# Patient Record
Sex: Female | Born: 2000
Health system: Southern US, Community
[De-identification: ages and names within clinical notes are randomized; demographics above are authoritative.]

## PROBLEM LIST (undated history)

## (undated) DIAGNOSIS — O039 Complete or unspecified spontaneous abortion without complication: Secondary | ICD-10-CM

## (undated) HISTORY — DX: Complete or unspecified spontaneous abortion without complication: O03.9

---
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
## 2018-02-08 ENCOUNTER — Inpatient Hospital Stay (HOSPITAL_COMMUNITY)
Admission: AD | Admit: 2018-02-08 | Discharge: 2018-02-09 | DRG: 779 | Disposition: A | Discharge status: Home / Self Care | Payer: BLUE CROSS/BLUE SHIELD | Attending: Certified Nurse Midwife | Admitting: Certified Nurse Midwife

## 2018-02-08 ENCOUNTER — Encounter (HOSPITAL_COMMUNITY): Payer: Self-pay | Admitting: *Deleted

## 2018-02-08 DIAGNOSIS — Z332 Encounter for elective termination of pregnancy: Secondary | ICD-10-CM | POA: Diagnosis not present

## 2018-02-08 DIAGNOSIS — Z3A18 18 weeks gestation of pregnancy: Secondary | ICD-10-CM | POA: Diagnosis not present

## 2018-02-08 DIAGNOSIS — O039 Complete or unspecified spontaneous abortion without complication: Secondary | ICD-10-CM | POA: Diagnosis not present

## 2018-02-08 DIAGNOSIS — O021 Missed abortion: Secondary | ICD-10-CM | POA: Diagnosis present

## 2018-02-08 LAB — COMPREHENSIVE METABOLIC PANEL
ALT: 12 U/L (ref 0–44)
ANION GAP: 11 (ref 5–15)
AST: 17 U/L (ref 15–41)
Albumin: 3.4 g/dL — ABNORMAL LOW (ref 3.5–5.0)
Alkaline Phosphatase: 42 U/L (ref 38–126)
BILIRUBIN TOTAL: 0.8 mg/dL (ref 0.3–1.2)
BUN: 8 mg/dL (ref 6–20)
CALCIUM: 8.8 mg/dL — AB (ref 8.9–10.3)
CO2: 19 mmol/L — ABNORMAL LOW (ref 22–32)
Chloride: 104 mmol/L (ref 98–111)
Creatinine, Ser: 0.48 mg/dL (ref 0.44–1.00)
GFR calc Af Amer: >60 mL/min (ref >60)
Glucose, Bld: 118 mg/dL — ABNORMAL HIGH (ref 70–99)
POTASSIUM: 3.5 mmol/L (ref 3.5–5.1)
Sodium: 134 mmol/L — ABNORMAL LOW (ref 135–145)
TOTAL PROTEIN: 6.4 g/dL — AB (ref 6.5–8.1)

## 2018-02-08 LAB — PROTIME-INR
INR: 1.06
Prothrombin Time: 13.7 seconds (ref 11.4–15.2)

## 2018-02-08 LAB — CBC
HCT: 31.9 % — ABNORMAL LOW (ref 36.0–46.0)
Hemoglobin: 11.5 g/dL — ABNORMAL LOW (ref 12.0–15.0)
MCH: 29.3 pg (ref 26.0–34.0)
MCHC: 36.1 g/dL — AB (ref 30.0–36.0)
MCV: 81.4 fL (ref 78.0–100.0)
Platelets: 211 10*3/uL (ref 150–400)
RBC: 3.92 MIL/uL (ref 3.87–5.11)
RDW: 12.7 % (ref 11.5–15.5)
WBC: 10.2 10*3/uL (ref 4.0–10.5)

## 2018-02-08 LAB — TYPE AND SCREEN
ABO/RH(D): O POS
ANTIBODY SCREEN: NEGATIVE

## 2018-02-08 LAB — SAVE SMEAR

## 2018-02-08 LAB — FIBRINOGEN: FIBRINOGEN: 382 mg/dL (ref 210–475)

## 2018-02-08 LAB — APTT: aPTT: 29 seconds (ref 24–36)

## 2018-02-08 MED ORDER — ACETAMINOPHEN 325 MG PO TABS: 650.0000 mg | ORAL_TABLET | ORAL | Status: DC | PRN

## 2018-02-08 MED ORDER — BUTORPHANOL TARTRATE 1 MG/ML IJ SOLN
2.0000 mg | INTRAMUSCULAR | Status: DC | PRN
  Administered 2018-02-09 (×2): 2 mg via INTRAVENOUS
  Filled 2018-02-08 (×4): qty 2

## 2018-02-08 MED ORDER — OXYCODONE-ACETAMINOPHEN 5-325 MG PO TABS: 1.0000 | ORAL_TABLET | ORAL | Status: DC | PRN

## 2018-02-08 MED ORDER — ONDANSETRON HCL 4 MG PO TABS: 4.0000 mg | ORAL_TABLET | Freq: Three times a day (TID) | ORAL | Status: DC | PRN

## 2018-02-08 MED ORDER — MISOPROSTOL 200 MCG PO TABS
400.0000 ug | ORAL_TABLET | ORAL | Status: AC | PRN
  Administered 2018-02-08 – 2018-02-09 (×3): 400 ug via VAGINAL
  Filled 2018-02-08 (×2): qty 2

## 2018-02-08 MED ORDER — SODIUM CHLORIDE 0.9% FLUSH: 3.0000 mL | INTRAVENOUS | Status: DC | PRN

## 2018-02-08 MED ORDER — LIDOCAINE HCL (PF) 1 % IJ SOLN: 30.0000 mL | INTRAMUSCULAR | Status: DC | PRN

## 2018-02-08 MED ORDER — OXYTOCIN BOLUS FROM INFUSION
500.0000 mL | Freq: Once | INTRAVENOUS | Status: AC
  Administered 2018-02-09: 500 mL via INTRAVENOUS

## 2018-02-08 MED ORDER — ONDANSETRON HCL 4 MG/2ML IJ SOLN
4.0000 mg | Freq: Four times a day (QID) | INTRAMUSCULAR | Status: DC | PRN
  Administered 2018-02-09: 4 mg via INTRAVENOUS
  Filled 2018-02-08: qty 2

## 2018-02-08 MED ORDER — LACTATED RINGERS IV SOLN: 500.0000 mL | INTRAVENOUS | Status: DC | PRN

## 2018-02-08 MED ORDER — OXYTOCIN 10 UNIT/ML IJ SOLN: 10.0000 [IU] | Freq: Once | INTRAMUSCULAR | Status: DC

## 2018-02-08 MED ORDER — SODIUM CHLORIDE 0.9 % IV SOLN: 250.0000 mL | INTRAVENOUS | Status: DC | PRN

## 2018-02-08 MED ORDER — OXYCODONE-ACETAMINOPHEN 5-325 MG PO TABS: 2.0000 | ORAL_TABLET | ORAL | Status: DC | PRN

## 2018-02-08 MED ORDER — OXYTOCIN 40 UNITS IN LACTATED RINGERS INFUSION - SIMPLE MED
2.5000 [IU]/h | INTRAVENOUS | Status: DC
  Filled 2018-02-08: qty 1000

## 2018-02-08 MED ORDER — MISOPROSTOL 200 MCG PO TABS
ORAL_TABLET | ORAL | Status: AC
  Administered 2018-02-08: 400 ug via VAGINAL
  Filled 2018-02-08: qty 2

## 2018-02-08 MED ORDER — HYDROXYZINE HCL 50 MG PO TABS: 50.0000 mg | ORAL_TABLET | Freq: Four times a day (QID) | ORAL | Status: DC | PRN

## 2018-02-08 MED ORDER — SODIUM CHLORIDE 0.9% FLUSH: 3.0000 mL | Freq: Two times a day (BID) | INTRAVENOUS | Status: DC

## 2018-02-08 MED ORDER — SOD CITRATE-CITRIC ACID 500-334 MG/5ML PO SOLN: 30.0000 mL | ORAL | Status: DC | PRN

## 2018-02-08 MED ORDER — ZOLPIDEM TARTRATE 5 MG PO TABS
5.0000 mg | ORAL_TABLET | Freq: Every evening | ORAL | Status: DC | PRN
  Administered 2018-02-09: 5 mg via ORAL
  Filled 2018-02-08: qty 1

## 2018-02-08 NOTE — H&P (Signed)
OB ADMISSION/ HISTORY & PHYSICAL:  Admission Date: 02/08/2018  6:01 PM  Admit Diagnosis: 16 WKS, MISCARRIAGE, INDUCTION    Haley Glover is a 17 y.o. female presenting for IOL, IUFD diagnosed Friday at routine anatomy scan. IUP at 19 wks, fetus measuring 16 wks, edema vs hydrops, no abruption, AFI wnl.  Patient counseled for IOL vs surgical management with D&E, and after a day of consideration, opted for medical management.  Reports mild menstrual like cramping for past few days, denies VB/LOF. No N/V.  Appropriately sad, has good family support from [place] / spouse and her parents.  Desires pathology/genetic testing on fetus.   Prenatal History: G1P0   EDC : 07/10/2018, by Other Basis  [facility] since 8   Prenatal course complicated by: Teen, hx abdominal sx 05/2018 - s/p peritonitis d/t ruptured appendix, UTI in 1st trim tx w/ Augmentin  Prenatal Labs: ABO, Rh:   O pos Antibody:  neg Rubella:   immune RPR:   NR HBsAg:   NR HIV: Non Reactive (12/25 2215)  No genetic testing  Medical / Surgical History :  Past medical history: No past medical history on file.   Past surgical history:  Past Surgical History:  Procedure Laterality Date  . LAPAROSCOPIC APPENDECTOMY N/A 06/11/2017   Procedure: APPENDECTOMY  OPEN;  Surgeon: Berna Bueonnor, Chelsea A, MD;  [facility];  Service: General;  Laterality: N/A;     Family History: History reviewed. No pertinent family history.   Social History:  reports that she has never smoked. She has never used smokeless tobacco. She reports that she does not drink alcohol or use drugs.   Allergies: Almond oil; Cherry extract; and Pineapple   Current Medications at time of admission:  Medications Prior to Admission  Medication Sig Dispense Refill Last Dose  . methocarbamol (ROBAXIN) 500 MG tablet Take 1 tablet (500 mg total) by mouth every 6 (six) hours as needed for muscle spasms. 20 tablet 0   . ondansetron  (ZOFRAN) 4 MG tablet Take 4 mg by mouth every 8 (eight) hours as needed for nausea or vomiting.   06/10/2017 at Unknown time  . Phenazopyridine HCl (AZO-STANDARD PO) Take 1-2 tablets by mouth every 3 (three) hours as needed (for pain).   06/10/2017 at am     Review of Systems: ROS  Physical Exam: Vital signs and nursing notes reviewed.   General: AAO x 3, NAD, coping appropriate Heart: RRR Lungs:CTAB Abdomen: Gravid, NT Extremities: no edema Genitalia / VE:   cvx long/thick/long, no lesions   Labs:   Pending T&S, CBC, CMP, DIC panel   Assessment/Plan:  17 y.o. G1P0 at 6950w2d per sure dates, IUFD TORCH and APL panel pending in office Risk of retained placenta/bleeding and need for surgical intervention discussed Cytotec IOL - 400 mcg vaginal q 3 hrs until active labor IV pain medication Stadol 2mg  q 3 hrs PRN Patient may have epidural if desires Bereavement support  POC for pathology/genetic testing   Dr Ernestina PennaFogleman notified of admission / plan of care   Neta Mendsaniela C Paul CNM, MSN 02/08/2018, 8:10 PM

## 2018-02-09 ENCOUNTER — Encounter (HOSPITAL_COMMUNITY): Payer: Self-pay

## 2018-02-09 ENCOUNTER — Inpatient Hospital Stay (HOSPITAL_COMMUNITY): Payer: BLUE CROSS/BLUE SHIELD | Admitting: Anesthesiology

## 2018-02-09 ENCOUNTER — Other Ambulatory Visit: Payer: Self-pay

## 2018-02-09 ENCOUNTER — Other Ambulatory Visit (HOSPITAL_COMMUNITY)
Admission: AD | Admit: 2018-02-09 | Discharge: 2018-02-09 | Disposition: A | Discharge status: Home / Self Care | Payer: 59 | Source: Ambulatory Visit | Attending: Certified Nurse Midwife | Admitting: Certified Nurse Midwife

## 2018-02-09 DIAGNOSIS — O039 Complete or unspecified spontaneous abortion without complication: Secondary | ICD-10-CM | POA: Diagnosis not present

## 2018-02-09 DIAGNOSIS — Z332 Encounter for elective termination of pregnancy: Secondary | ICD-10-CM | POA: Diagnosis not present

## 2018-02-09 LAB — ABO/RH: ABO/RH(D): O POS

## 2018-02-09 LAB — RPR: RPR Ser Ql: NONREACTIVE

## 2018-02-09 MED ORDER — PHENYLEPHRINE 40 MCG/ML (10ML) SYRINGE FOR IV PUSH (FOR BLOOD PRESSURE SUPPORT)
80.0000 ug | PREFILLED_SYRINGE | INTRAVENOUS | Status: DC | PRN
  Filled 2018-02-09: qty 10

## 2018-02-09 MED ORDER — PHENYLEPHRINE 40 MCG/ML (10ML) SYRINGE FOR IV PUSH (FOR BLOOD PRESSURE SUPPORT): 80.0000 ug | PREFILLED_SYRINGE | INTRAVENOUS | Status: DC | PRN

## 2018-02-09 MED ORDER — EPHEDRINE 5 MG/ML INJ: 10.0000 mg | INTRAVENOUS | Status: DC | PRN

## 2018-02-09 MED ORDER — MISOPROSTOL 200 MCG PO TABS
400.0000 ug | ORAL_TABLET | Freq: Once | ORAL | Status: AC
  Administered 2018-02-09: 400 ug via VAGINAL
  Filled 2018-02-09: qty 2

## 2018-02-09 MED ORDER — LIDOCAINE HCL (PF) 1 % IJ SOLN
INTRAMUSCULAR | Status: DC | PRN
  Administered 2018-02-09: 5 mL via EPIDURAL

## 2018-02-09 MED ORDER — HYDROXYZINE HCL 50 MG PO TABS: 50.0000 mg | ORAL_TABLET | Freq: Four times a day (QID) | ORAL | 0 refills | Status: DC | PRN

## 2018-02-09 MED ORDER — LACTATED RINGERS IV SOLN
INTRAVENOUS | Status: DC
  Administered 2018-02-09: 10:00:00 via INTRAVENOUS

## 2018-02-09 MED ORDER — IBUPROFEN 600 MG PO TABS: 600.0000 mg | ORAL_TABLET | Freq: Four times a day (QID) | ORAL | Status: DC | PRN

## 2018-02-09 MED ORDER — LACTATED RINGERS IV SOLN
500.0000 mL | Freq: Once | INTRAVENOUS | Status: DC
  Administered 2018-02-09: 500 mL via INTRAVENOUS

## 2018-02-09 MED ORDER — IBUPROFEN 600 MG PO TABS: 600.0000 mg | ORAL_TABLET | Freq: Four times a day (QID) | ORAL | 0 refills | Status: DC | PRN

## 2018-02-09 MED ORDER — FENTANYL 2.5 MCG/ML BUPIVACAINE 1/10 % EPIDURAL INFUSION (WH - ANES)
14.0000 mL/h | INTRAMUSCULAR | Status: DC | PRN
  Administered 2018-02-09: 14 mL/h via EPIDURAL
  Filled 2018-02-09: qty 100

## 2018-02-09 MED ORDER — DIPHENHYDRAMINE HCL 50 MG/ML IJ SOLN: 12.5000 mg | INTRAMUSCULAR | Status: DC | PRN

## 2018-02-09 MED ORDER — LACTATED RINGERS IV SOLN: 500.0000 mL | Freq: Once | INTRAVENOUS | Status: DC

## 2018-02-09 NOTE — Progress Notes (Signed)
I spent time throughout the day with Haley Glover and Haley Glover and their family as they labored and delivered their baby, Haley Glover.  I offered spiritual and emotional support as they began to process the grief around the loss of their baby.  I also provided them with resources for grief support post discharge.  At [facility] request, I also baptized the baby and offered prayer.  The family is appropriately tearful, but has good support.  They are aware of how to reach spiritual care for continued support.  Please page as further needs arise.  Haley Glover, M.Div. [facility] Chaplain Pager [phone] Office [phone]

## 2018-02-09 NOTE — Discharge Summary (Signed)
OB Discharge Summary  Patient Name: Haley Glover DOB: 2000-12-06 MRN: [id number]  Date of admission: 02/08/2018 Admitting diagnosis: IUFD Intrauterine pregnancy: [redacted]w[redacted]d      Date of discharge: 02/09/2018    Discharge diagnosis: complete abortion after IUFD at 18 weeks     Prenatal history: G1P0   EDC : 07/10/2018, by Other B[facility]  P[facility] care at [facility]  Primary provider : Renae Fickle CNM  Prenatal Labs: ABO, Rh: O positive Antibody: NEG (08/25 2047)                          Hospital course:   Admit for induction of labor with cytotec protocol with 2nd trimester loss (18 wk IUFD with measurements c/w 16 week size). Induction of successful labor after 4 doses cytotec vaginally.   Pain management with Stadol IV and epidural anesthesia.   Spontaneous expulsion of fetus with ROM (MSF-moderate). Non-viable stillborn without any signs of life. Fetus approximately 15-16 week size with hydroptic appearance. Placenta manually removed without difficulty - intact. Placenta to pathology. Parents desire genetic and pathology evaluation of fetus and placenta - undecided at this time whether private cremation services or hospital disposal.   Vaginal and perineum intact - scant bleeding <56ml at delivery.  Family and social worker at bedside for support.  Delivering PROVIDER: Marlinda Mike CNM                                                         Newborn Data: stillborn fetus @ 15-16 week size APGAR: 0, 0 Genetic sampling by RN in birthing suites prior to release of fetus to pathology.   Labs: Lab Results  Component Value Date   WBC 10.2 02/08/2018   HGB 11.5 (L) 02/08/2018   HCT 31.9 (L) 02/08/2018   MCV 81.4 02/08/2018   PLT 211 02/08/2018   CMP Latest Ref Rng & Units 02/08/2018  Glucose 70 - 99 mg/dL 425(Z)  BUN 6 - 20 mg/dL 8  Creatinine 5.63 - 8.75 mg/dL 6.43  Sodium 329 - 518 mmol/L 134(L)  Potassium 3.5 - 5.1 mmol/L 3.5  Chloride 98 - 111  mmol/L 104  CO2 22 - 32 mmol/L 19(L)  Calcium 8.9 - 10.3 mg/dL 8.4(Z)  Total Protein 6.5 - 8.1 g/dL 6.4(L)  Total Bilirubin 0.3 - 1.2 mg/dL 0.8  Alkaline Phos 38 - 126 U/L 42  AST 15 - 41 U/L 17  ALT 0 - 44 U/L 12   Physical Exam @ time of discharge:  Vitals:   02/09/18 1345 02/09/18 1400 02/09/18 1415 02/09/18 1430  BP: 115/68 (!) 111/55 (!) 123/57 118/60  Pulse: 94 (!) 108 (!) 115 (!) 105  Resp: 16     Temp:      TempSrc:      SpO2:      Weight:      Height:        General: alert, cooperative and no distress Lochia: appropriate Uterine Fundus: firm Extremities: no edema DVT Evaluation: No evidence of DVT seen on physical exam.   Discharge instructions:  Call for any fever, chills, pain, heavy bleeding. No tampons - use pads only. No sex x 4 weeks. Activity ad lib. Office visit 1 week at [facility] with CNM to  assess emotional well-being. Genetic and pathology results may take 2-6 weeks to be final.   Discharge Medications:  Allergies as of 02/09/2018      Reactions   Almond Oil Other (See Comments)   Makes the mouth burn/bleed   Cherry Extract Other (See Comments)   Makes the mouth burn/bleed   Pineapple Other (See Comments)   Makes the mouth bleed      Medication List    STOP taking these medications   B-6 PO   ondansetron 4 MG tablet Commonly known as:  ZOFRAN   ranitidine 75 MG tablet Commonly known as:  ZANTAC     TAKE these medications   hydrOXYzine 50 MG tablet Commonly known as:  ATARAX/VISTARIL Take 1 tablet (50 mg total) by mouth every 6 (six) hours as needed for anxiety.   ibuprofen 600 MG tablet Commonly known as:  ADVIL,MOTRIN Take 1 tablet (600 mg total) by mouth every 6 (six) hours as needed for moderate pain or cramping.   prenatal multivitamin Tabs tablet Take 1 tablet by mouth daily at 12 noon.      Diet: routine diet  Activity: Advance as tolerated. Pelvic rest x 6 weeks.   Follow up:1 week - call for  appointment    Signed: Marlinda Mikeanya Bailey CNM, MSN, [facility] 02/09/2018, 2:44 PM

## 2018-02-09 NOTE — Anesthesia Procedure Notes (Signed)
Epidural Patient location during procedure: OB Start time: 02/09/2018 9:12 AM End time: 02/09/2018 9:32 AM  Staffing Anesthesiologist: Trevor IhaHouser, Tidus Upchurch A, MD Performed: anesthesiologist   Preanesthetic Checklist Completed: patient identified, site marked, surgical consent, pre-op evaluation, timeout performed, IV checked, risks and benefits discussed and monitors and equipment checked  Epidural Patient position: sitting Prep: site prepped and draped and DuraPrep Patient monitoring: continuous pulse ox and blood pressure Approach: midline Location: L3-L4 Injection technique: LOR air  Needle:  Needle type: Tuohy  Needle gauge: 17 G Needle length: 9 cm and 9 Needle insertion depth: 5 cm cm Catheter type: closed end flexible Catheter size: 19 Gauge Catheter at skin depth: 10 cm Test dose: negative  Assessment Events: blood not aspirated, injection not painful, no injection resistance, negative IV test and no paresthesia  Additional Notes Pt tolerated procedure well I attempt

## 2018-02-09 NOTE — Progress Notes (Signed)
CSW received consult due to IUFD.  CSW available for support [facility] and will await call from Chaplain before becoming involved.  CSW paged Chaplain to ensure she is aware of patient's needs.  CSW screening out referral at this time.

## 2018-02-09 NOTE — Anesthesia Postprocedure Evaluation (Signed)
Anesthesia Post Note  Patient: Haley Glover  Procedure(s) Performed: AN AD HOC LABOR EPIDURAL     Patient location during evaluation: Mother Baby Anesthesia Type: Epidural Level of consciousness: awake Pain management: satisfactory to patient Vital Signs Assessment: post-procedure vital signs reviewed and stable Respiratory status: spontaneous breathing Cardiovascular status: stable Anesthetic complications: no    Last Vitals:  Vitals:   02/09/18 1600 02/09/18 1615  BP: (!) 105/59 115/62  Pulse: 99 98  Resp:    Temp:    SpO2:      Last Pain:  Vitals:   02/09/18 1545  TempSrc:   PainSc: 2    Pain Goal: Patients Stated Pain Goal: 2 (02/09/18 1047)               Cephus ShellingBURGER,Saidee Geremia

## 2018-02-09 NOTE — Discharge Instructions (Signed)
Miscarriage A miscarriage is the sudden loss of an unborn baby (fetus) before the 20th week of pregnancy. Most miscarriages happen in the first 3 months of pregnancy. Sometimes, it happens before a woman even knows she is pregnant. A miscarriage is also called a "spontaneous miscarriage" or "early pregnancy loss." Having a miscarriage can be an emotional experience. Talk with your caregiver about any questions you may have about miscarrying, the grieving process, and your future pregnancy plans. What are the causes?  Problems with the fetal chromosomes that make it impossible for the baby to develop normally. Problems with the baby's genes or chromosomes are most often the result of errors that occur, by chance, as the embryo divides and grows. The problems are not inherited from the parents.  Infection of the cervix or uterus.  Hormone problems.  Problems with the cervix, such as having an incompetent cervix. This is when the tissue in the cervix is not strong enough to hold the pregnancy.  Problems with the uterus, such as an abnormally shaped uterus, uterine fibroids, or congenital abnormalities.  Certain medical conditions.  Smoking, drinking alcohol, or taking illegal drugs.  Trauma. Often, the cause of a miscarriage is unknown. Follow these instructions at home:  Your caregiver may order bed rest or may allow you to continue light activity. Resume activity as directed by your caregiver.  Have someone help with home and family responsibilities during this time.  Keep track of the number of sanitary pads you use each day and how soaked (saturated) they are. Write down this information.  Do not use tampons. Do not douche or have sexual intercourse until approved by your caregiver.  Only take over-the-counter or prescription medicines for pain or discomfort as directed by your caregiver.  Do not take aspirin. Aspirin can cause bleeding.  Keep all follow-up appointments with your  caregiver.  If you or your partner have problems with grieving, talk to your caregiver or seek counseling to help cope with the pregnancy loss. Allow enough time to grieve before trying to get pregnant again. Get help right away if:  You have severe cramps or pain in your back or abdomen.  You have a fever.  You pass large blood clots (walnut-sized or larger) ortissue from your vagina. Save any tissue for your caregiver to inspect.  Your bleeding increases.  You have a thick, bad-smelling vaginal discharge.  You become lightheaded, weak, or you faint.  You have chills. This information is not intended to replace advice given to you by your health care provider. Make sure you discuss any questions you have with your health care provider. Document Released: 11/27/2000 Document Revised: 11/09/2015 Document Reviewed: 07/23/2011 Elsevier Interactive Patient Education  2017 [employer]

## 2018-02-09 NOTE — Progress Notes (Signed)
Ivonne Andrewaniella Paul, CNM called to  Unit for update. Notified second dose of Cytotec was placed, cervix was closed and thick at that time, pt received Ambien for sleep and requesting IV pain meds at this time for pain rated a 6. Order confirmed pt may have Stadol now after receiving Ambien. Orders received for epidural and Nitrous prn for pain.

## 2018-02-09 NOTE — Anesthesia Preprocedure Evaluation (Signed)
Anesthesia Evaluation  Patient identified by MRN, date of birth, ID band Patient awake    Reviewed: Allergy & Precautions, NPO status , Patient's Chart, lab work & pertinent test results  Airway Mallampati: II  TM Distance: >3 FB Neck ROM: Full    Dental no notable dental hx. (+) Teeth Intact   Pulmonary    Pulmonary exam normal breath sounds clear to auscultation       Cardiovascular negative cardio ROS Normal cardiovascular exam Rhythm:Regular Rate:Normal     Neuro/Psych negative neurological ROS  negative psych ROS   GI/Hepatic Neg liver ROS,   Endo/Other    Renal/GU negative Renal ROS     Musculoskeletal   Abdominal   Peds negative pediatric ROS (+)  Hematology negative hematology ROS (+)   Anesthesia Other Findings 18Wk  IUFD  Reproductive/Obstetrics                             Anesthesia Physical Anesthesia Plan  ASA: II  Anesthesia Plan: Epidural   Post-op Pain Management:    Induction:   PONV Risk Score and Plan:   Airway Management Planned:   Additional Equipment:   Intra-op Plan:   Post-operative Plan:   Informed Consent: I have reviewed the patients History and Physical, chart, labs and discussed the procedure including the risks, benefits and alternatives for the proposed anesthesia with the patient or authorized representative who has indicated his/her understanding and acceptance.   Dental advisory given  Plan Discussed with:   Anesthesia Plan Comments:         Anesthesia Quick Evaluation

## 2018-02-09 NOTE — Progress Notes (Signed)
Haley Glover is a 17 y.o. G1P0 at 4128w3d by LMP admitted for IOL/IUFD  Subjective: Reports cramping started severe after 2nd dose of Cytotec, adequate pain control with Stadol until now, but pain getting more intense. Notes some bloody show with voids.   S/P Cytotec 400 mcg PV x 4  Objective: Vitals:   02/09/18 0200 02/09/18 0300 02/09/18 0400 02/09/18 0610  BP: (!) 82/47 (!) 73/36 (!) 84/33 (!) 111/52  Pulse: 84 75 78 (!) 111  Resp: 14 14 16 18   Temp:      TempSrc:      SpO2: 96% 98%  98%  Weight:      Height:        I/O last 3 completed shifts: In: -  Out: 250 [Urine:250] No intake/output data recorded.  SVE:   Dilation: 1 Effacement (%): 50 Exam by:D. Renae FicklePaul, CNM  Labs:   Recent Labs    02/08/18 2047  WBC 10.2  HGB 11.5*  HCT 31.9*  PLT 211   DIC profile WNL - PT 12.7, INR 1.06, PTT 29  Assessment / Plan: G1 at 18.3 wks, IOL / IUFD  Rh positive, no coagulopathy  Labor: Cervical ripening effective after 4 doses of Cytotec, will repeat one more dose due at 0930. Pain Control:  IV pain meds and plan epidural now I/D:  no s/sx of chorio Anticipated MOD:  NSVD  Neta Mendsaniela C Isacc Turney, CNM, MSN 02/09/2018, 8:49 AM

## 2018-02-26 LAB — TISSUE HYBRIDIZATION TO NCBH

## 2018-02-27 LAB — CHROMOSOME ANALYSIS, PERIPHERAL BLOOD

## 2018-03-03 DIAGNOSIS — F4323 Adjustment disorder with mixed anxiety and depressed mood: Secondary | ICD-10-CM | POA: Diagnosis not present

## 2018-03-16 DIAGNOSIS — F4323 Adjustment disorder with mixed anxiety and depressed mood: Secondary | ICD-10-CM | POA: Diagnosis not present

## 2018-03-23 DIAGNOSIS — F4323 Adjustment disorder with mixed anxiety and depressed mood: Secondary | ICD-10-CM | POA: Diagnosis not present

## 2018-03-30 DIAGNOSIS — F4323 Adjustment disorder with mixed anxiety and depressed mood: Secondary | ICD-10-CM | POA: Diagnosis not present

## 2018-05-04 DIAGNOSIS — F4323 Adjustment disorder with mixed anxiety and depressed mood: Secondary | ICD-10-CM | POA: Diagnosis not present

## 2018-05-07 ENCOUNTER — Ambulatory Visit: Payer: 59 | Admitting: Allergy and Immunology

## 2018-05-07 ENCOUNTER — Encounter: Payer: Self-pay | Admitting: Allergy and Immunology

## 2018-05-07 VITALS — BP 96/68 | HR 100 | Temp 98.8°F | Resp 16 | Ht 65.7 in | Wt 118.8 lb

## 2018-05-07 DIAGNOSIS — J301 Allergic rhinitis due to pollen: Secondary | ICD-10-CM

## 2018-05-07 DIAGNOSIS — J3089 Other allergic rhinitis: Secondary | ICD-10-CM

## 2018-05-07 DIAGNOSIS — T781XXD Other adverse food reactions, not elsewhere classified, subsequent encounter: Secondary | ICD-10-CM

## 2018-05-07 DIAGNOSIS — Z91018 Allergy to other foods: Secondary | ICD-10-CM

## 2018-05-07 MED ORDER — MONTELUKAST SODIUM 10 MG PO TABS: ORAL_TABLET | ORAL | 5 refills | Status: AC

## 2018-05-07 MED ORDER — MONTELUKAST SODIUM 10 MG PO TABS: ORAL_TABLET | ORAL | 5 refills | Status: DC

## 2018-05-07 MED ORDER — AUVI-Q 0.3 MG/0.3ML IJ SOAJ: INTRAMUSCULAR | 3 refills | Status: AC

## 2018-05-07 NOTE — Patient Instructions (Addendum)
  1.  Allergen avoidance measures  2.  Auvi-Q 0.3, Benadryl, MD/ER evaluation for allergic reaction  3.  Treat and prevent inflammation starting Valentine's Day 2020:   A.  OTC Nasacort 1-2 sprays each nostril 1 time per day  B.  Montelukast 10 mg tablet 1 time per day  C.  OTC antihistamine if needed  4.  Consider a course of immunotherapy  5.  Obtain flu vaccine every fall  6.  Return to clinic in 1 year or earlier if problem

## 2018-05-07 NOTE — Progress Notes (Signed)
NEW PATIENT NOTE  Referring Provider: No ref. provider found Primary Provider: Patient, No Pcp Per Date of office visit: 05/07/2018    Subjective:   Chief Complaint:  Haley Glover (DOB: 07/27/2000) is a 17 y.o. female who presents to the clinic on 05/07/2018 with a chief complaint of Allergy Testing (Foods causing mouth itching) .  HPI: Haley Glover presents this clinic in evaluation of 2 main issues.  First, she has a history of springtime nasal congestion and runny nose and sneezing and itchy eyes for which she uses a mask whenever she goes outdoors and uses Nasonex on occasion.  This has been a long-standing issue of many years duration.  Second, she has had problems with food since early life.  Apparently very early in life when she ate cherries she developed itchy mouth and hives.  She has not eaten any cherries since that point in time.  She has had a problem with tree nuts with the development of mouth and throat and tongue itching.  If she touches almonds she developed hand itching.  Recently she had a peanut butter cup and developed very significant mouth and tongue and throat itching.  When she eats apples she developed mouth itching.  Past Medical History:  Diagnosis Date  . Miscarriage     Past Surgical History:  Procedure Laterality Date  . LAPAROSCOPIC APPENDECTOMY N/A 06/11/2017   Procedure: APPENDECTOMY  OPEN;  Surgeon: Berna Bueonnor, Chelsea A, MD;  [facility];  Service: General;  Laterality: N/A;    Allergies as of 05/07/2018      Reactions   Almond Oil Other (See Comments)   Makes the mouth burn/bleed   Cherry Extract Other (See Comments)   Makes the mouth burn/bleed   Pineapple Other (See Comments)   Makes the mouth bleed      Medication List    none      Review of systems negative except as noted in HPI / PMHx or noted below:  Review of Systems  Constitutional: Negative.   HENT: Negative.   Eyes: Negative.   Respiratory: Negative.     Cardiovascular: Negative.   Gastrointestinal: Negative.   Genitourinary: Negative.   Musculoskeletal: Negative.   Skin: Negative.   Neurological: Negative.   Endo/Heme/Allergies: Negative.   Psychiatric/Behavioral: Negative.     Family History  Problem Relation Age of Onset  . Skin cancer Paternal Grandmother     Social History   Socioeconomic History  . Marital status: Married    Spouse name: Clarene Reamerravis Procida  . Number of children: Not on file  . Years of education: Not on file  . Highest education level: Not on file  Occupational History  . Not on file  Social Needs  . Financial resource strain: Not on file  . Food insecurity:    Worry: Not on file    Inability: Not on file  . Transportation needs:    Medical: Not on file    Non-medical: Not on file  Tobacco Use  . Smoking status: Current Every Day Smoker    Types: E-cigarettes  . Smokeless tobacco: Never Used  Substance and Sexual Activity  . Alcohol use: No    Frequency: Never  . Drug use: No  . Sexual activity: Yes  Lifestyle  . Physical activity:    Days per week: Not on file    Minutes per session: Not on file  . Stress: Not on file  Relationships  . Social connections:  Talks on phone: Not on file    Gets together: Not on file    Attends religious service: Not on file    Active member of club or organization: Not on file    Attends meetings of clubs or organizations: Not on file    Relationship status: Not on file  . Intimate partner violence:    Fear of current or ex partner: Not on file    Emotionally abused: Not on file    Physically abused: Not on file    Forced sexual activity: Not on file  Other Topics Concern  . Not on file  Social History Narrative  . Not on file    Environmental and Social history  Lives in a house with a dry environment, cats and dogs located inside the household, carpet in the bedroom, no plastic on the bed, no plastic on the pillow, and actively using vape  products.  Objective:   Vitals:   05/07/18 1004  BP: 96/68  Pulse: 100  Resp: 16  Temp: 98.8 F (37.1 C)   Height: 5' 5.7" (166.9 cm) Weight: 118 lb 12.8 oz (53.9 kg)  Physical Exam  HENT:  Head: Normocephalic. Head is without right periorbital erythema and without left periorbital erythema.  Right Ear: Tympanic membrane, external ear and ear canal normal.  Left Ear: Tympanic membrane, external ear and ear canal normal.  Nose: Nose normal. No mucosal edema or rhinorrhea.  Mouth/Throat: Uvula is midline, oropharynx is clear and moist and mucous membranes are normal. No oropharyngeal exudate.  Eyes: Pupils are equal, round, and reactive to light. Conjunctivae and lids are normal.  Neck: Trachea normal. No tracheal tenderness present. No tracheal deviation present. No thyromegaly present.  Cardiovascular: Normal rate, regular rhythm, S1 normal, S2 normal and normal heart sounds.  No murmur heard. Pulmonary/Chest: Effort normal and breath sounds normal. No stridor. No respiratory distress. She has no wheezes. She has no rales. She exhibits no tenderness.  Abdominal: Soft. She exhibits no distension and no mass. There is no hepatosplenomegaly. There is no tenderness. There is no rebound and no guarding.  Musculoskeletal: She exhibits no edema or tenderness.  Lymphadenopathy:       Head (right side): No tonsillar adenopathy present.       Head (left side): No tonsillar adenopathy present.    She has no cervical adenopathy.    She has no axillary adenopathy.  Neurological: She is alert.  Skin: No rash noted. She is not diaphoretic. No erythema. No pallor. Nails show no clubbing.    Diagnostics: Allergy skin tests were performed.  She demonstrated hypersensitivity to grasses, weeds, trees, dust mite.  She also demonstrated hypersensitivity to soy, sesame, walnut, almond, pistachio, hops, navy bean, onion, and mustard.  Assessment and Plan:    1. Perennial allergic rhinitis   2.  Seasonal allergic rhinitis due to pollen   3. Pollen-food allergy, subsequent encounter   4. Food allergy     1.  Allergen avoidance measures  2.  Auvi-Q 0.3, Benadryl, MD/ER evaluation for allergic reaction  3.  Treat and prevent inflammation starting Valentine's Day 2020:   A.  OTC Nasacort 1-2 sprays each nostril 1 time per day  B.  Montelukast 10 mg tablet 1 time per day  C.  OTC antihistamine if needed  4.  Consider a course of immunotherapy  5.  Obtain flu vaccine every fall  6.  Return to clinic in 1 year or earlier if problem  Katha has atopic  immune system directed against aeroallergens including pollens and also appears to have oral allergy syndrome and possible systemic reactions to specific food products.  We will get her to perform allergen avoidance measures as best as possible and she has the option of utilizing anti-inflammatory agents for her airway as noted above.  I have given her literature on immunotherapy.  I think this is the only way that she will resolve her oral allergy syndrome.  Although immunotherapy is not indicated for oral allergy syndrome it is very common for people to resolve their pollen induced to oral allergy symptoms when undergoing a course of immunotherapy directed against pollen.  She will keep me informed about her response to this approach.  Laurette Schimke, MD Allergy / Immunology [facility]

## 2018-05-11 ENCOUNTER — Encounter: Payer: Self-pay | Admitting: Allergy and Immunology

## 2018-05-18 DIAGNOSIS — F4323 Adjustment disorder with mixed anxiety and depressed mood: Secondary | ICD-10-CM | POA: Diagnosis not present

## 2018-06-03 DIAGNOSIS — Z23 Encounter for immunization: Secondary | ICD-10-CM | POA: Diagnosis not present

## 2018-06-03 DIAGNOSIS — Z7689 Persons encountering health services in other specified circumstances: Secondary | ICD-10-CM | POA: Diagnosis not present

## 2018-06-23 DIAGNOSIS — F4323 Adjustment disorder with mixed anxiety and depressed mood: Secondary | ICD-10-CM | POA: Diagnosis not present

## 2018-07-01 DIAGNOSIS — F4323 Adjustment disorder with mixed anxiety and depressed mood: Secondary | ICD-10-CM | POA: Diagnosis not present

## 2018-07-13 DIAGNOSIS — F4323 Adjustment disorder with mixed anxiety and depressed mood: Secondary | ICD-10-CM | POA: Diagnosis not present

## 2018-07-22 DIAGNOSIS — Z309 Encounter for contraceptive management, unspecified: Secondary | ICD-10-CM | POA: Diagnosis not present

## 2018-07-27 DIAGNOSIS — F4323 Adjustment disorder with mixed anxiety and depressed mood: Secondary | ICD-10-CM | POA: Diagnosis not present

## 2018-08-14 DIAGNOSIS — Z30016 Encounter for initial prescription of transdermal patch hormonal contraceptive device: Secondary | ICD-10-CM | POA: Diagnosis not present

## 2018-08-24 DIAGNOSIS — F4323 Adjustment disorder with mixed anxiety and depressed mood: Secondary | ICD-10-CM | POA: Diagnosis not present

## 2018-12-23 ENCOUNTER — Encounter (HOSPITAL_COMMUNITY): Payer: Self-pay

## 2018-12-23 ENCOUNTER — Other Ambulatory Visit: Payer: Self-pay

## 2018-12-23 ENCOUNTER — Emergency Department (HOSPITAL_COMMUNITY)
Admission: EM | Admit: 2018-12-23 | Discharge: 2018-12-24 | Disposition: A | Discharge status: Home / Self Care | Payer: 59 | Attending: Emergency Medicine | Admitting: Emergency Medicine

## 2018-12-23 DIAGNOSIS — M549 Dorsalgia, unspecified: Secondary | ICD-10-CM | POA: Diagnosis not present

## 2018-12-23 DIAGNOSIS — G4489 Other headache syndrome: Secondary | ICD-10-CM | POA: Diagnosis not present

## 2018-12-23 DIAGNOSIS — M791 Myalgia, unspecified site: Secondary | ICD-10-CM | POA: Diagnosis not present

## 2018-12-23 DIAGNOSIS — B349 Viral infection, unspecified: Secondary | ICD-10-CM | POA: Diagnosis not present

## 2018-12-23 DIAGNOSIS — Z79899 Other long term (current) drug therapy: Secondary | ICD-10-CM | POA: Diagnosis not present

## 2018-12-23 DIAGNOSIS — F17290 Nicotine dependence, other tobacco product, uncomplicated: Secondary | ICD-10-CM | POA: Insufficient documentation

## 2018-12-23 DIAGNOSIS — R51 Headache: Secondary | ICD-10-CM | POA: Diagnosis present

## 2018-12-23 LAB — COMPREHENSIVE METABOLIC PANEL
ALT: 9 U/L (ref 0–44)
AST: 13 U/L — ABNORMAL LOW (ref 15–41)
Albumin: 3.8 g/dL (ref 3.5–5.0)
Alkaline Phosphatase: 51 U/L (ref 38–126)
Anion gap: 9 (ref 5–15)
BUN: 6 mg/dL (ref 6–20)
CO2: 22 mmol/L (ref 22–32)
Calcium: 9.1 mg/dL (ref 8.9–10.3)
Chloride: 106 mmol/L (ref 98–111)
Creatinine, Ser: 0.62 mg/dL (ref 0.44–1.00)
GFR calc Af Amer: >60 mL/min (ref >60)
GFR calc non Af Amer: >60 mL/min (ref >60)
Glucose, Bld: 87 mg/dL (ref 70–99)
Potassium: 3.8 mmol/L (ref 3.5–5.1)
Sodium: 137 mmol/L (ref 135–145)
Total Bilirubin: 1.1 mg/dL (ref 0.3–1.2)
Total Protein: 6.9 g/dL (ref 6.5–8.1)

## 2018-12-23 LAB — I-STAT BETA HCG BLOOD, ED (MC, WL, AP ONLY): I-stat hCG, quantitative: <5 m[IU]/mL (ref <5)

## 2018-12-23 LAB — CBC WITH DIFFERENTIAL/PLATELET
Abs Immature Granulocytes: 0.06 10*3/uL (ref 0.00–0.07)
Basophils Absolute: 0 10*3/uL (ref 0.0–0.1)
Basophils Relative: 0 %
Eosinophils Absolute: 0 10*3/uL (ref 0.0–0.5)
Eosinophils Relative: 0 %
HCT: 37 % (ref 36.0–46.0)
Hemoglobin: 12.7 g/dL (ref 12.0–15.0)
Immature Granulocytes: 1 %
Lymphocytes Relative: 15 %
Lymphs Abs: 1.7 10*3/uL (ref 0.7–4.0)
MCH: 28.8 pg (ref 26.0–34.0)
MCHC: 34.3 g/dL (ref 30.0–36.0)
MCV: 83.9 fL (ref 80.0–100.0)
Monocytes Absolute: 0.8 10*3/uL (ref 0.1–1.0)
Monocytes Relative: 7 %
Neutro Abs: 8.6 10*3/uL — ABNORMAL HIGH (ref 1.7–7.7)
Neutrophils Relative %: 77 %
Platelets: 217 10*3/uL (ref 150–400)
RBC: 4.41 MIL/uL (ref 3.87–5.11)
RDW: 11.9 % (ref 11.5–15.5)
WBC: 11.2 10*3/uL — ABNORMAL HIGH (ref 4.0–10.5)
nRBC: 0 % (ref 0.0–0.2)

## 2018-12-23 LAB — URINALYSIS, ROUTINE W REFLEX MICROSCOPIC
Bilirubin Urine: NEGATIVE
Glucose, UA: NEGATIVE mg/dL
Hgb urine dipstick: NEGATIVE
Ketones, ur: 20 mg/dL — AB
Leukocytes,Ua: NEGATIVE
Nitrite: NEGATIVE
Protein, ur: NEGATIVE mg/dL
Specific Gravity, Urine: 1.015 (ref 1.005–1.030)
pH: 7 (ref 5.0–8.0)

## 2018-12-23 MED ORDER — SODIUM CHLORIDE 0.9% FLUSH: 3.0000 mL | Freq: Once | INTRAVENOUS | Status: DC

## 2018-12-23 NOTE — ED Triage Notes (Signed)
Pt reports headache, fever and back pain since 430 this morning, Pt seen at [facility] and sent here for a lumbar puncture. Also reports intermittent blurred vision. Pt a.o, nad noted at this time.

## 2018-12-24 ENCOUNTER — Emergency Department (HOSPITAL_COMMUNITY): Payer: 59

## 2018-12-24 DIAGNOSIS — G4489 Other headache syndrome: Secondary | ICD-10-CM | POA: Diagnosis not present

## 2018-12-24 LAB — CSF CELL COUNT WITH DIFFERENTIAL
RBC Count, CSF: 1 /mm3 — ABNORMAL HIGH
RBC Count, CSF: 90 /mm3 — ABNORMAL HIGH
Tube #: 1
Tube #: 4
WBC, CSF: 0 /mm3 (ref 0–5)
WBC, CSF: 1 /mm3 (ref 0–5)

## 2018-12-24 LAB — GLUCOSE, CSF: Glucose, CSF: 51 mg/dL (ref 40–70)

## 2018-12-24 LAB — PROTEIN, CSF: Total  Protein, CSF: 17 mg/dL (ref 15–45)

## 2018-12-24 MED ORDER — IOHEXOL 350 MG/ML SOLN
75.0000 mL | Freq: Once | INTRAVENOUS | Status: AC | PRN
  Administered 2018-12-24: 75 mL via INTRAVENOUS

## 2018-12-24 MED ORDER — IBUPROFEN 600 MG PO TABS: 600.0000 mg | ORAL_TABLET | Freq: Four times a day (QID) | ORAL | 0 refills | Status: AC | PRN

## 2018-12-24 MED ORDER — METOCLOPRAMIDE HCL 5 MG/ML IJ SOLN
10.0000 mg | Freq: Once | INTRAMUSCULAR | Status: AC
  Administered 2018-12-24: 10 mg via INTRAVENOUS
  Filled 2018-12-24: qty 2

## 2018-12-24 MED ORDER — DIPHENHYDRAMINE HCL 50 MG/ML IJ SOLN
25.0000 mg | Freq: Once | INTRAMUSCULAR | Status: AC
  Administered 2018-12-24: 25 mg via INTRAVENOUS
  Filled 2018-12-24: qty 1

## 2018-12-24 NOTE — ED Provider Notes (Signed)
Patient signout to me by Dr. Kathrynn Humble pending LP results.  Those were noted and patient did have red blood cells in the first 2 but cleared by the last tube.  On exam she has no focal neurological deficits.  Headache has improved.  And will discharge home   Haley Leigh, MD 12/24/18 [phone]

## 2018-12-24 NOTE — Discharge Instructions (Addendum)
Take Tylenol every 6 hours for headaches and he can also take Motrin in between. If you continue to have the headaches consider seeing the neurologist.

## 2018-12-24 NOTE — ED Provider Notes (Signed)
[facility] Provider Note   CSN: [id number] Arrival date & time: 12/23/18  1906     History   Chief Complaint Chief Complaint  Patient presents with  . Headache  . Back Pain  . Fever    HPI Haley Glover is a 18 y.o. female.     HPI 18 year old comes in a chief complaint of headache back pain and fever. She has no significant medical history.  She reports that she started having a headache along with sore throat about 24 hours ago.  Over time she has noticed that she has had persistent headaches, which at their worst was 10 out of 10.  She is also having neck pain.  Patient has had subjective fevers.  She has no history of aneurysms, brain bleed in the family.  Patient denies any recent trauma.  She was seen at the urgent care with her symptoms, she was advised to come to the ER immediately for ruling out meningitis.  She denies any sick contacts.  Past Medical History:  Diagnosis Date  . Miscarriage     Patient Active Problem List   Diagnosis Date Noted  . Abortion in second trimester - complete (IUFD) 02/09/2018  . Fetal demise before 20 weeks with retention of dead fetus 02/08/2018  . Perforated appendicitis 06/10/2017    Past Surgical History:  Procedure Laterality Date  . LAPAROSCOPIC APPENDECTOMY N/A 06/11/2017   Procedure: APPENDECTOMY  OPEN;  Surgeon: Berna Bueonnor, Chelsea A, MD;  Location: [facility] OR;  Service: General;  Laterality: N/A;     OB History    Gravida  1   Para  1   Term      Preterm      AB      Living        SAB      TAB      Ectopic      Multiple  0   Live Births               Home Medications    Prior to Admission medications   Medication Sig Start Date End Date Taking? Authorizing Provider  AUVI-Q 0.3 MG/0.3ML SOAJ injection Use as directed for life threatening allergic reactions 05/07/18   Kozlow, Alvira PhilipsEric J, MD  ibuprofen (ADVIL) 600 MG tablet Take 1 tablet (600 mg total) by mouth every 6  (six) hours as needed. 12/24/18   Derwood KaplanNanavati, Kasmira Cacioppo, MD  montelukast (SINGULAIR) 10 MG tablet Take one tablet once daily 05/07/18   Kozlow, Alvira PhilipsEric J, MD    Family History Family History  Problem Relation Age of Onset  . Skin cancer Paternal Grandmother     Social History Social History   Tobacco Use  . Smoking status: Current Every Day Smoker    Types: E-cigarettes  . Smokeless tobacco: Never Used  Substance Use Topics  . Alcohol use: No    Frequency: Never  . Drug use: No     Allergies   Almond oil, Cherry extract, and Pineapple   Review of Systems Review of Systems  Constitutional: Positive for activity change.  Eyes: Positive for photophobia.  Cardiovascular: Negative for chest pain.  Gastrointestinal: Negative for nausea.  Musculoskeletal: Positive for neck stiffness.  Allergic/Immunologic: Negative for immunocompromised state.  Neurological: Positive for headaches.  Hematological: Does not bruise/bleed easily.  All other systems reviewed and are negative.    Physical Exam Updated Vital Signs BP 99/61 (BP Location: Left Arm)   Pulse 87   Temp  98.2 F (36.8 C) (Oral)   Resp 16   Ht 5\' 5"  (1.651 m)   Wt 52.6 kg   SpO2 100%   BMI 19.30 kg/m   Physical Exam Vitals signs and nursing note reviewed.  Constitutional:      Appearance: She is well-developed.  HENT:     Head: Normocephalic and atraumatic.  Neck:     Musculoskeletal: Normal range of motion and neck supple.  Cardiovascular:     Rate and Rhythm: Normal rate.  Pulmonary:     Effort: Pulmonary effort is normal.  Abdominal:     General: Bowel sounds are normal.  Skin:    General: Skin is warm and dry.  Neurological:     Mental Status: She is alert and oriented to person, place, and time.     GCS: GCS eye subscore is 4. GCS verbal subscore is 5. GCS motor subscore is 6.      ED Treatments / Results  Labs (all labs ordered are listed, but only abnormal results are displayed) Labs Reviewed   COMPREHENSIVE METABOLIC PANEL - Abnormal; Notable for the following components:      Result Value   AST 13 (*)    All other components within normal limits  CBC WITH DIFFERENTIAL/PLATELET - Abnormal; Notable for the following components:   WBC 11.2 (*)    Neutro Abs 8.6 (*)    All other components within normal limits  URINALYSIS, ROUTINE W REFLEX MICROSCOPIC - Abnormal; Notable for the following components:   Ketones, ur 20 (*)    All other components within normal limits  I-STAT BETA HCG BLOOD, ED ([facility], [facility], [facility] ONLY)    EKG None  Radiology No results found.  Procedures .Lumbar Puncture  Date/Time: 12/24/2018 7:12 AM Performed by: Varney Biles, MD Authorized by: Varney Biles, MD   Consent:    Consent obtained:  Verbal   Consent given by:  Patient   Risks discussed:  Bleeding, infection, pain, nerve damage and headache   Alternatives discussed:  No treatment Universal protocol:    Procedure explained and questions answered to patient or proxy's satisfaction: yes     Immediately prior to procedure a time out was called: yes     Site/side marked: yes     Patient identity confirmed:  Arm band Pre-procedure details:    Procedure purpose:  Diagnostic   Preparation: Patient was prepped and draped in usual sterile fashion   Anesthesia (see MAR for exact dosages):    Anesthesia method:  Local infiltration   Local anesthetic:  Lidocaine 1% WITH epi Procedure details:    Lumbar space:  L4-L5 interspace   Patient position:  Sitting   Needle gauge:  18   Needle type:  Diamond point   Ultrasound guidance: no     Number of attempts:  1   Total volume (ml):  15 Post-procedure:    Puncture site:  Adhesive bandage applied   Patient tolerance of procedure:  Tolerated well, no immediate complications   (including critical care time)  Medications Ordered in ED Medications  sodium chloride flush (NS) 0.9 % injection 3 mL (has no administration in time range)     Initial  Impression / Assessment and Plan / ED Course  I have reviewed the triage vital signs and the nursing notes.  Pertinent labs & imaging results that were available during my care of the patient were reviewed by me and considered in my medical decision making (see chart for details).  Patient comes in a chief complaint of headache. She is also having neck pain, subjective fever.  She was sent to the ER for LP to rule out meningitis and subarachnoid hemorrhage.  I ordered CT angios head and neck.  I doubt she has meningitis, but patient is wanting to get an LP to rule out bleed.  She is outside the 6-hour window for this severe headache, therefore CT head by itself might not be adequate to rule out bleed.  Final Clinical Impressions(s) / ED Diagnoses   Final diagnoses:  Acute viral syndrome  Myalgia    ED Discharge Orders         Ordered    ibuprofen (ADVIL) 600 MG tablet  Every 6 hours PRN     12/24/18 0430           Derwood KaplanNanavati, Lenoria Narine, MD 12/24/18 [id number]

## 2018-12-24 NOTE — ED Notes (Signed)
Pt discharged from ED; instructions provided and scripts given; Pt encouraged to return to ED if symptoms worsen and to f/u with PCP; Pt verbalized understanding of all instructions 

## 2018-12-27 LAB — CSF CULTURE W GRAM STAIN: Gram Stain: NONE SEEN

## 2018-12-27 LAB — HSV CULTURE AND TYPING

## 2018-12-27 LAB — CSF CULTURE
Culture: NO GROWTH
Special Requests: NORMAL

## 2018-12-31 ENCOUNTER — Other Ambulatory Visit: Payer: Self-pay | Admitting: Internal Medicine

## 2018-12-31 DIAGNOSIS — Z20822 Contact with and (suspected) exposure to covid-19: Secondary | ICD-10-CM

## 2019-01-05 LAB — NOVEL CORONAVIRUS, NAA: SARS-CoV-2, NAA: NOT DETECTED

## 2021-02-21 IMAGING — CT CT ANGIOGRAPHY NECK
1 of 10 series · 5 of 33 positions shown · IV contrast (omnipaque)
Comparison: None.

CLINICAL DATA: 18-year-old female with headache fever an back pain
since 2472 hours today. Intermittent blurred vision.

EXAM:
CT ANGIOGRAPHY HEAD AND NECK
TECHNIQUE: Multidetector CT imaging of the head and neck was performed using
the standard protocol during bolus administration of intravenous
contrast. Multiplanar CT image reconstructions and MIPs were
obtained to evaluate the vascular anatomy. Carotid stenosis
measurements (when applicable) are obtained utilizing NASCET
criteria, using the distal internal carotid diameter as the
denominator.
CONTRAST:  75mL OMNIPAQUE IOHEXOL 350 MG/ML SOLN

[Series 7: cta neck axial · axial · 0.47mm/px · z∈[+1012,+1256]mm · 5 of 366 slices shown]
[im 61/366  soft-tissue]
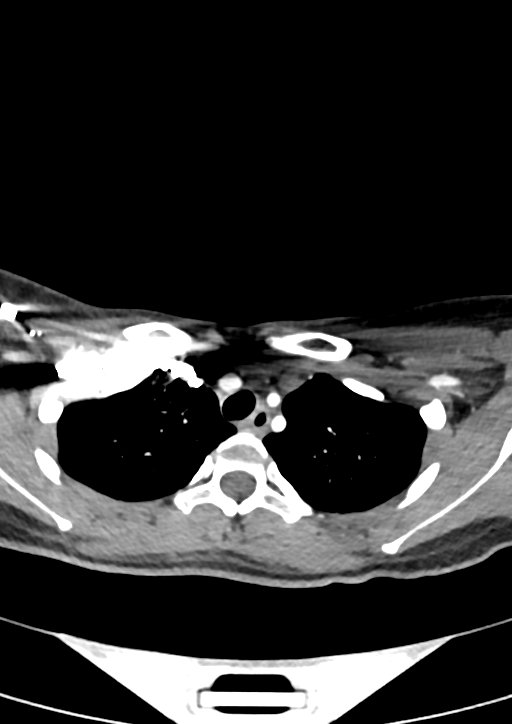
[im 122/366  bone]
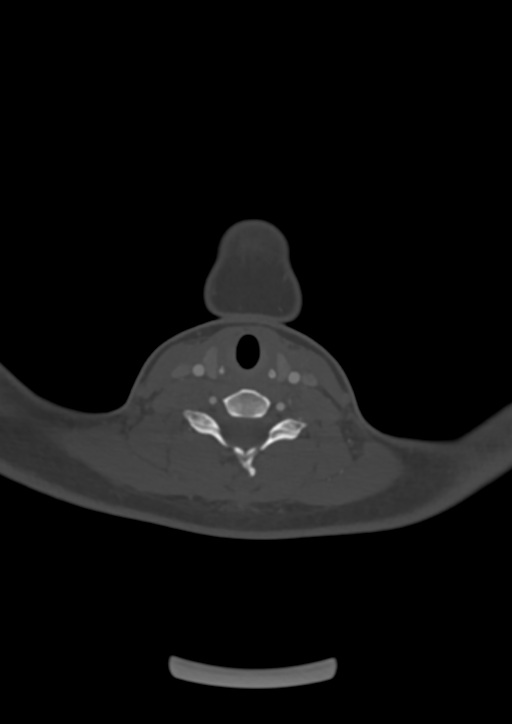
[im 183/366  soft-tissue]
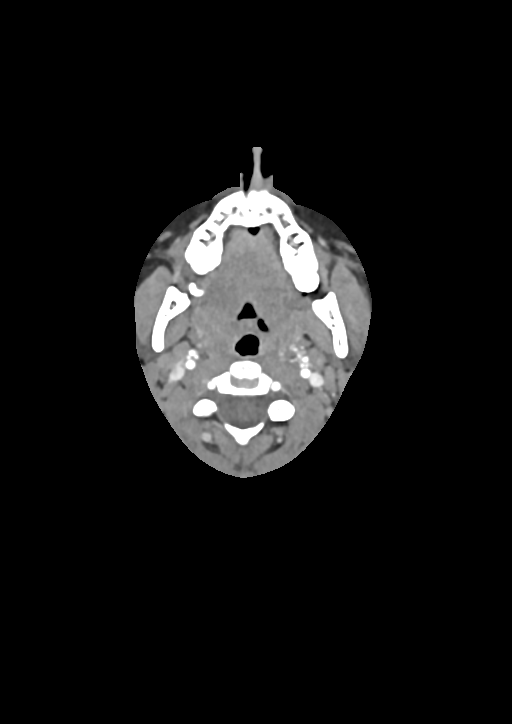
[im 244/366  bone]
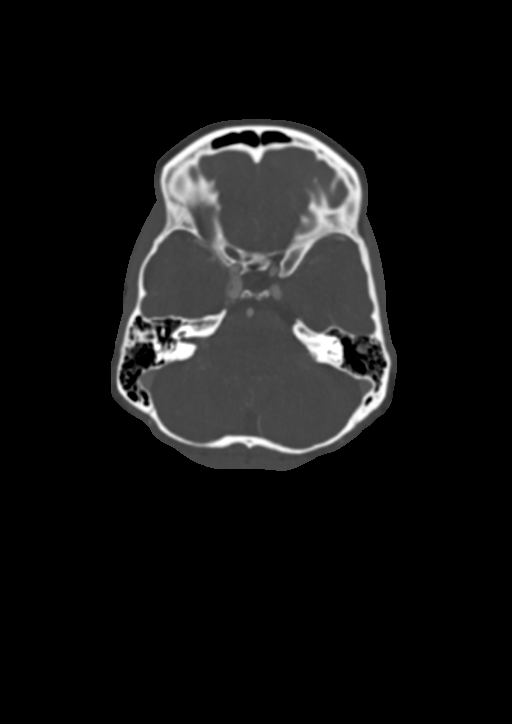
[im 305/366  soft-tissue]
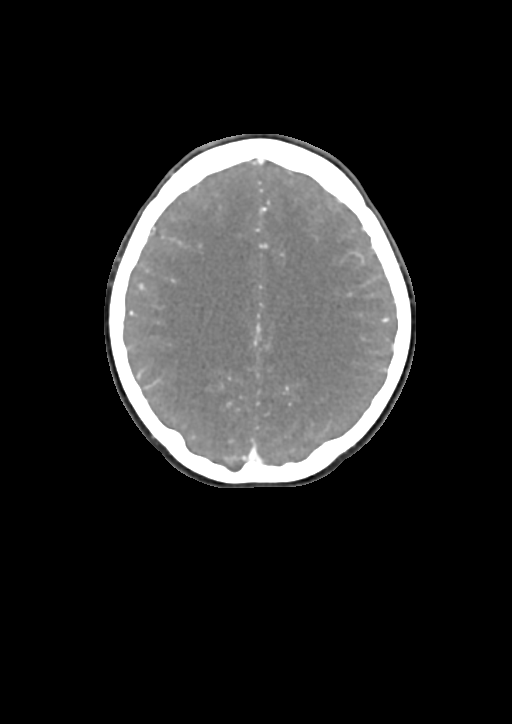

[5 of 33 positions shown; findings below may reference images not displayed]

FINDINGS: CT HEAD

Brain: Normal cerebral volume. No midline shift, ventriculomegaly,
mass effect, evidence of mass lesion, intracranial hemorrhage or
evidence of cortically based acute infarction. Gray-white matter
differentiation is within normal limits throughout the brain.

Calvarium and skull base: Negative.

Paranasal sinuses: Clear.  Tympanic cavities and mastoids are clear.

Orbits: Negative orbit and scalp soft tissues.

CTA NECK

Skeleton: Negative.

Upper chest: Negative upper lungs. Small volume residual thymus.
Negative visible mediastinum.

Other neck: Negative thyroid; subcentimeter hypodense nodules which
do not meet size criteria for ultrasound. Other neck soft tissues
are negative; bilateral cervical lymph nodes are at the upper limits
of normal for age.

Aortic arch: Mild cardiac pulsation. Normal 3 vessel arch. Central
pulmonary arteries also appear patent.

Right carotid system: Negative.

Left carotid system: Negative.

Vertebral arteries:
Normal proximal right subclavian artery and right vertebral artery
origin. Normal right vertebral artery to the skull base.

Normal proximal left subclavian artery and left vertebral artery
origin. Normal left vertebral artery to the skull base.

CTA HEAD

Posterior circulation: Normal codominant distal vertebral arteries
and vertebrobasilar junction. The a ICAs appear dominant. A small
right PICA origin is identified and normal on series 7, image 138.
Normal basilar artery, SCA and PCA origins. Both posterior
communicating arteries are present. Bilateral PCA branches are
within normal limits.

Anterior circulation: Both ICA siphons are patent and normal. Normal
ophthalmic and posterior communicating artery origins. Normal
carotid termini, MCA and ACA origins. Anterior communicating artery
and bilateral ACA branches are normal. Left MCA M1 and bifurcation
are patent without stenosis. Left MCA branches are normal. Right M1
and right MCA trifurcation are patent without stenosis. Right MCA
branches are normal.

Venous sinuses: Patent.

Anatomic variants: None

Review of the MIP images confirms the above findings
IMPRESSION: 1.  Normal CT appearance of the brain.
2. Normal CTA head and neck.
